# Patient Record
Sex: Male | Born: 2000
Health system: Southern US, Community
[De-identification: ages and names within clinical notes are randomized; demographics above are authoritative.]

## PROBLEM LIST (undated history)

## (undated) DIAGNOSIS — Z889 Allergy status to unspecified drugs, medicaments and biological substances status: Secondary | ICD-10-CM

---
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
## 2018-01-30 DIAGNOSIS — D229 Melanocytic nevi, unspecified: Secondary | ICD-10-CM | POA: Diagnosis not present

## 2018-01-30 DIAGNOSIS — D2230 Melanocytic nevi of unspecified part of face: Secondary | ICD-10-CM | POA: Diagnosis not present

## 2018-01-30 DIAGNOSIS — D225 Melanocytic nevi of trunk: Secondary | ICD-10-CM | POA: Diagnosis not present

## 2018-01-30 DIAGNOSIS — D485 Neoplasm of uncertain behavior of skin: Secondary | ICD-10-CM | POA: Diagnosis not present

## 2019-12-08 DIAGNOSIS — J302 Other seasonal allergic rhinitis: Secondary | ICD-10-CM | POA: Diagnosis not present
